# Patient Record
Sex: Male | Born: 1970 | Race: White | Hispanic: No | Smoking: Current every day smoker
Health system: Southern US, Community
[De-identification: ages and names within clinical notes are randomized; demographics above are authoritative.]

## PROBLEM LIST (undated history)

## (undated) DIAGNOSIS — M549 Dorsalgia, unspecified: Secondary | ICD-10-CM

## (undated) DIAGNOSIS — O2230 Deep phlebothrombosis in pregnancy, unspecified trimester: Secondary | ICD-10-CM

## (undated) DIAGNOSIS — Z960 Presence of urogenital implants: Secondary | ICD-10-CM

## (undated) DIAGNOSIS — M869 Osteomyelitis, unspecified: Secondary | ICD-10-CM

## (undated) DIAGNOSIS — G8220 Paraplegia, unspecified: Secondary | ICD-10-CM

## (undated) DIAGNOSIS — G8929 Other chronic pain: Secondary | ICD-10-CM

## (undated) DIAGNOSIS — R252 Cramp and spasm: Secondary | ICD-10-CM

## (undated) DIAGNOSIS — W3400XA Accidental discharge from unspecified firearms or gun, initial encounter: Secondary | ICD-10-CM

## (undated) DIAGNOSIS — Z8614 Personal history of Methicillin resistant Staphylococcus aureus infection: Secondary | ICD-10-CM

## (undated) DIAGNOSIS — Z79891 Long term (current) use of opiate analgesic: Secondary | ICD-10-CM

## (undated) DIAGNOSIS — E7800 Pure hypercholesterolemia, unspecified: Secondary | ICD-10-CM

## (undated) DIAGNOSIS — N319 Neuromuscular dysfunction of bladder, unspecified: Secondary | ICD-10-CM

## (undated) HISTORY — DX: Other chronic pain: G89.29

## (undated) HISTORY — PX: WISDOM TOOTH EXTRACTION: SHX21

## (undated) HISTORY — DX: Deep phlebothrombosis in pregnancy, unspecified trimester: O22.30

## (undated) HISTORY — DX: Paraplegia, unspecified: G82.20

## (undated) HISTORY — DX: Dorsalgia, unspecified: M54.9

## (undated) HISTORY — DX: Pure hypercholesterolemia, unspecified: E78.00

## (undated) HISTORY — DX: Osteomyelitis, unspecified: M86.9

## (undated) HISTORY — DX: Cramp and spasm: R25.2

## (undated) HISTORY — DX: Accidental discharge from unspecified firearms or gun, initial encounter: W34.00XA

## (undated) HISTORY — DX: Long term (current) use of opiate analgesic: Z79.891

## (undated) HISTORY — DX: Neuromuscular dysfunction of bladder, unspecified: N31.9

---

## 1989-08-12 DIAGNOSIS — W3400XA Accidental discharge from unspecified firearms or gun, initial encounter: Secondary | ICD-10-CM

## 1989-08-12 HISTORY — DX: Accidental discharge from unspecified firearms or gun, initial encounter: W34.00XA

## 2005-03-04 ENCOUNTER — Emergency Department: Payer: Self-pay | Admitting: Emergency Medicine

## 2006-08-18 ENCOUNTER — Emergency Department: Payer: Self-pay | Admitting: Emergency Medicine

## 2006-08-22 ENCOUNTER — Emergency Department: Payer: Self-pay | Admitting: Emergency Medicine

## 2006-08-25 ENCOUNTER — Inpatient Hospital Stay: Payer: Self-pay | Admitting: Internal Medicine

## 2006-09-04 ENCOUNTER — Ambulatory Visit: Payer: Self-pay | Admitting: Infectious Diseases

## 2007-01-27 ENCOUNTER — Emergency Department: Payer: Self-pay

## 2007-02-04 ENCOUNTER — Inpatient Hospital Stay: Payer: Self-pay | Admitting: General Surgery

## 2007-05-06 ENCOUNTER — Emergency Department: Payer: Self-pay | Admitting: Emergency Medicine

## 2007-07-02 ENCOUNTER — Encounter: Payer: Self-pay | Admitting: Internal Medicine

## 2007-07-13 ENCOUNTER — Encounter: Payer: Self-pay | Admitting: Internal Medicine

## 2007-09-05 ENCOUNTER — Emergency Department: Payer: Self-pay | Admitting: Emergency Medicine

## 2007-09-10 ENCOUNTER — Encounter: Payer: Self-pay | Admitting: Internal Medicine

## 2007-09-18 ENCOUNTER — Ambulatory Visit: Payer: Self-pay | Admitting: Internal Medicine

## 2007-09-24 ENCOUNTER — Encounter: Payer: Self-pay | Admitting: Internal Medicine

## 2007-09-28 ENCOUNTER — Ambulatory Visit: Payer: Self-pay | Admitting: Internal Medicine

## 2007-10-11 ENCOUNTER — Encounter: Payer: Self-pay | Admitting: Internal Medicine

## 2007-10-16 ENCOUNTER — Encounter: Payer: Self-pay | Admitting: Internal Medicine

## 2007-11-11 ENCOUNTER — Encounter: Payer: Self-pay | Admitting: Internal Medicine

## 2007-12-11 ENCOUNTER — Encounter: Payer: Self-pay | Admitting: Internal Medicine

## 2008-01-04 ENCOUNTER — Ambulatory Visit: Payer: Self-pay

## 2008-01-11 ENCOUNTER — Encounter: Payer: Self-pay | Admitting: Internal Medicine

## 2008-04-13 ENCOUNTER — Ambulatory Visit: Payer: Self-pay | Admitting: Internal Medicine

## 2008-06-15 ENCOUNTER — Ambulatory Visit: Payer: Self-pay

## 2008-08-03 ENCOUNTER — Ambulatory Visit: Payer: Self-pay | Admitting: Internal Medicine

## 2008-08-17 ENCOUNTER — Ambulatory Visit: Payer: Self-pay | Admitting: Internal Medicine

## 2008-08-29 ENCOUNTER — Ambulatory Visit: Payer: Self-pay | Admitting: Internal Medicine

## 2008-09-03 ENCOUNTER — Inpatient Hospital Stay: Payer: Self-pay | Admitting: Internal Medicine

## 2008-09-19 ENCOUNTER — Ambulatory Visit: Payer: Self-pay | Admitting: Internal Medicine

## 2008-10-10 ENCOUNTER — Ambulatory Visit: Payer: Self-pay | Admitting: Internal Medicine

## 2008-10-20 ENCOUNTER — Encounter: Payer: Self-pay | Admitting: Internal Medicine

## 2008-10-21 ENCOUNTER — Ambulatory Visit: Payer: Self-pay | Admitting: Internal Medicine

## 2008-11-10 ENCOUNTER — Ambulatory Visit: Payer: Self-pay | Admitting: Specialist

## 2008-11-21 ENCOUNTER — Encounter: Payer: Self-pay | Admitting: Internal Medicine

## 2008-12-10 ENCOUNTER — Encounter: Payer: Self-pay | Admitting: Internal Medicine

## 2009-01-13 ENCOUNTER — Ambulatory Visit: Payer: Self-pay | Admitting: Internal Medicine

## 2009-01-18 ENCOUNTER — Ambulatory Visit: Payer: Self-pay | Admitting: Internal Medicine

## 2009-01-23 ENCOUNTER — Encounter: Payer: Self-pay | Admitting: Internal Medicine

## 2009-02-27 ENCOUNTER — Encounter: Payer: Self-pay | Admitting: Internal Medicine

## 2009-03-02 ENCOUNTER — Emergency Department: Payer: Self-pay

## 2009-03-10 ENCOUNTER — Inpatient Hospital Stay: Payer: Self-pay | Admitting: Internal Medicine

## 2009-03-24 ENCOUNTER — Ambulatory Visit: Payer: Self-pay | Admitting: Surgery

## 2009-03-27 ENCOUNTER — Ambulatory Visit: Payer: Self-pay | Admitting: Internal Medicine

## 2009-03-29 ENCOUNTER — Ambulatory Visit: Payer: Self-pay | Admitting: Surgery

## 2009-04-05 ENCOUNTER — Ambulatory Visit: Payer: Self-pay | Admitting: Urology

## 2009-04-19 ENCOUNTER — Ambulatory Visit: Payer: Self-pay | Admitting: Family Medicine

## 2009-04-24 ENCOUNTER — Ambulatory Visit: Payer: Self-pay | Admitting: Internal Medicine

## 2009-05-22 ENCOUNTER — Ambulatory Visit: Payer: Self-pay | Admitting: Internal Medicine

## 2009-06-19 ENCOUNTER — Ambulatory Visit: Payer: Self-pay | Admitting: Internal Medicine

## 2009-06-23 ENCOUNTER — Ambulatory Visit: Payer: Self-pay | Admitting: Family Medicine

## 2009-07-11 ENCOUNTER — Ambulatory Visit: Payer: Self-pay | Admitting: Internal Medicine

## 2009-07-20 ENCOUNTER — Ambulatory Visit: Payer: Self-pay | Admitting: Internal Medicine

## 2009-08-12 HISTORY — PX: WOUND DEBRIDEMENT: SHX247

## 2009-08-14 ENCOUNTER — Ambulatory Visit: Payer: Self-pay | Admitting: Internal Medicine

## 2009-09-11 ENCOUNTER — Ambulatory Visit: Payer: Self-pay | Admitting: Internal Medicine

## 2009-10-09 ENCOUNTER — Ambulatory Visit: Payer: Self-pay | Admitting: Internal Medicine

## 2009-11-27 ENCOUNTER — Inpatient Hospital Stay: Payer: Self-pay | Admitting: *Deleted

## 2010-08-12 HISTORY — PX: PORTACATH PLACEMENT: SHX2246

## 2010-08-12 HISTORY — PX: OTHER SURGICAL HISTORY: SHX169

## 2010-08-12 HISTORY — PX: CHOLECYSTECTOMY: SHX55

## 2010-08-12 HISTORY — PX: MUSCLE FLAP CLOSURE: SHX2054

## 2010-09-05 ENCOUNTER — Emergency Department: Payer: Self-pay | Admitting: Unknown Physician Specialty

## 2011-01-02 ENCOUNTER — Inpatient Hospital Stay: Payer: Self-pay | Admitting: Internal Medicine

## 2012-02-25 ENCOUNTER — Emergency Department: Payer: Self-pay | Admitting: Emergency Medicine

## 2012-02-25 LAB — URINALYSIS, COMPLETE
Bilirubin,UR: NEGATIVE
Blood: NEGATIVE
Glucose,UR: NEGATIVE mg/dL (ref 0–75)
Protein: >=500
Specific Gravity: 1.016 (ref 1.003–1.030)
Squamous Epithelial: 7

## 2012-03-06 DIAGNOSIS — N319 Neuromuscular dysfunction of bladder, unspecified: Secondary | ICD-10-CM | POA: Insufficient documentation

## 2012-05-05 DIAGNOSIS — I82409 Acute embolism and thrombosis of unspecified deep veins of unspecified lower extremity: Secondary | ICD-10-CM | POA: Insufficient documentation

## 2012-05-05 DIAGNOSIS — Z9289 Personal history of other medical treatment: Secondary | ICD-10-CM | POA: Insufficient documentation

## 2012-05-05 DIAGNOSIS — G8929 Other chronic pain: Secondary | ICD-10-CM | POA: Insufficient documentation

## 2012-05-05 DIAGNOSIS — G8220 Paraplegia, unspecified: Secondary | ICD-10-CM | POA: Insufficient documentation

## 2012-05-05 DIAGNOSIS — L8990 Pressure ulcer of unspecified site, unspecified stage: Secondary | ICD-10-CM | POA: Insufficient documentation

## 2012-05-05 DIAGNOSIS — Z7983 Long term (current) use of bisphosphonates: Secondary | ICD-10-CM | POA: Insufficient documentation

## 2012-05-06 DIAGNOSIS — E7800 Pure hypercholesterolemia, unspecified: Secondary | ICD-10-CM | POA: Insufficient documentation

## 2012-07-01 ENCOUNTER — Ambulatory Visit: Payer: Self-pay | Admitting: Surgery

## 2012-07-01 LAB — CBC WITH DIFFERENTIAL/PLATELET
Basophil %: 0.8 %
Eosinophil #: 0.2 10*3/uL (ref 0.0–0.7)
HCT: 42.5 % (ref 40.0–52.0)
HGB: 14.8 g/dL (ref 13.0–18.0)
Lymphocyte #: 2.9 10*3/uL (ref 1.0–3.6)
MCH: 32 pg (ref 26.0–34.0)
MCHC: 34.9 g/dL (ref 32.0–36.0)
MCV: 92 fL (ref 80–100)
Monocyte #: 0.7 x10 3/mm (ref 0.2–1.0)
Neutrophil #: 4.1 10*3/uL (ref 1.4–6.5)
WBC: 8 10*3/uL (ref 3.8–10.6)

## 2012-07-01 LAB — BASIC METABOLIC PANEL
BUN: 13 mg/dL (ref 7–18)
Calcium, Total: 8.9 mg/dL (ref 8.5–10.1)
Creatinine: 0.63 mg/dL (ref 0.60–1.30)
EGFR (African American): >60
EGFR (Non-African Amer.): >60
Glucose: 75 mg/dL (ref 65–99)
Potassium: 4.2 mmol/L (ref 3.5–5.1)
Sodium: 141 mmol/L (ref 136–145)

## 2012-07-08 ENCOUNTER — Ambulatory Visit: Payer: Self-pay | Admitting: Surgery

## 2012-07-31 ENCOUNTER — Ambulatory Visit: Payer: Self-pay | Admitting: Anesthesiology

## 2012-09-22 DIAGNOSIS — Z79891 Long term (current) use of opiate analgesic: Secondary | ICD-10-CM | POA: Insufficient documentation

## 2013-08-12 HISTORY — PX: PORT-A-CATH REMOVAL: SHX5289

## 2013-09-13 DIAGNOSIS — M62838 Other muscle spasm: Secondary | ICD-10-CM | POA: Insufficient documentation

## 2014-11-29 NOTE — Op Note (Signed)
PATIENT NAME:  Maurice Simmons, Maurice Simmons MR#:  [id number] DATE OF BIRTH:  December 02, 1970  DATE OF PROCEDURE:  07/08/2012  PREOPERATIVE DIAGNOSIS: Paraplegia, desire for Port-A-Cath removal.   POSTOPERATIVE DIAGNOSIS: Paraplegia, desire for Port-A-Cath removal.   PROCEDURE PERFORMED: Removal of right chest Infuse-a-Port.   SURGEON: Sherri Rad, M.D.   ASSISTANT: None.   ANESTHESIA: Local with heavy intravenous sedation.   SPECIMENS: None. The Port-A-Cath was given to the patient at his request.   DESCRIPTION OF PROCEDURE: With the patient in the supine position, local anesthesia was induced. The right chest was clipped of hair, prepped and draped with ChloraPrep solution. Time-out was observed. Local anesthesia was infiltrated around the chest Port-A-Cath site. A transverse skin incision was fashioned and carried down with sharp dissection and electrocautery to the fibrous capsule. The fibrous capsule was incised. The Port-A-Cath enucleated without difficulty and with the patient in the sitting up position was removed slowly with pressure directed on to the internal jugular vein on the right side. Hemostasis appeared to be adequate on the operative field. The capsule of the Port-A-Cath was excised and discarded. Additional local anesthesia was infiltrated. The wound was obliterated in the deep space with 2-0 Vicryl  interrupted deep dermal, 4-0 Vicryl subcuticular in the skin, benzoin, Steri-Strips, and a sterile occlusive dressing.  The Port-A-Cath was given to the patient at his request.   ____________________________ Jeannette How. Marina Gravel, MD [id number]:bjt D: 07/08/2012 11:03:55 ET T: 07/08/2012 11:16:32 ET JOB#: [id number]  cc:  Dory Horn. Eliberto Ivory, MD Caedyn Raygoza Bettina Gavia MD ELECTRONICALLY SIGNED 07/11/2012 17:30

## 2015-01-30 ENCOUNTER — Ambulatory Visit: Payer: Medicare Other | Attending: Family Medicine | Admitting: Physical Therapy

## 2015-01-30 ENCOUNTER — Encounter: Payer: Self-pay | Admitting: Physical Therapy

## 2015-01-30 DIAGNOSIS — G8220 Paraplegia, unspecified: Secondary | ICD-10-CM | POA: Diagnosis present

## 2015-01-30 DIAGNOSIS — X58XXXD Exposure to other specified factors, subsequent encounter: Secondary | ICD-10-CM | POA: Diagnosis not present

## 2015-01-30 DIAGNOSIS — T148 Other injury of unspecified body region: Secondary | ICD-10-CM | POA: Diagnosis present
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
## 2015-09-12 ENCOUNTER — Other Ambulatory Visit: Payer: Self-pay

## 2015-09-14 ENCOUNTER — Encounter: Payer: Self-pay | Admitting: Surgery

## 2015-09-14 ENCOUNTER — Ambulatory Visit (INDEPENDENT_AMBULATORY_CARE_PROVIDER_SITE_OTHER): Payer: Medicare Other | Admitting: Surgery

## 2015-09-14 ENCOUNTER — Other Ambulatory Visit: Payer: Self-pay

## 2015-09-14 VITALS — BP 163/88 | HR 94 | Temp 97.5°F | Ht 74.0 in | Wt 198.0 lb

## 2015-09-14 DIAGNOSIS — L723 Sebaceous cyst: Secondary | ICD-10-CM | POA: Diagnosis not present

## 2015-09-14 NOTE — Patient Instructions (Signed)
You will need to have an MRI of your chest and follow-up in the office with Dr. Burt Knack afterwards.   Central scheduling will be calling you to get this scheduled. If you have not heard from their office in 3 days, please call and let our office know.   Please follow-up with Dr. Burt Knack as scheduled below.
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
## 2015-10-03 ENCOUNTER — Ambulatory Visit: Admission: RE | Admit: 2015-10-03 | Payer: Medicare Other | Source: Ambulatory Visit

## 2015-10-03 ENCOUNTER — Ambulatory Visit
Admission: RE | Admit: 2015-10-03 | Discharge: 2015-10-03 | Disposition: A | Discharge status: Home / Self Care | Payer: Medicare Other | Source: Ambulatory Visit | Attending: Surgery | Admitting: Surgery

## 2015-10-03 DIAGNOSIS — L723 Sebaceous cyst: Secondary | ICD-10-CM | POA: Diagnosis not present

## 2015-10-03 MED ORDER — GADOBENATE DIMEGLUMINE 529 MG/ML IV SOLN
20.0000 mL | Freq: Once | INTRAVENOUS | Status: AC | PRN
  Administered 2015-10-03: 20 mL via INTRAVENOUS

## 2015-10-06 ENCOUNTER — Encounter: Payer: Self-pay | Admitting: Surgery

## 2015-10-06 ENCOUNTER — Ambulatory Visit (INDEPENDENT_AMBULATORY_CARE_PROVIDER_SITE_OTHER): Payer: Medicare Other | Admitting: Surgery

## 2015-10-06 VITALS — BP 133/82 | HR 112 | Temp 98.4°F | Wt 198.0 lb

## 2015-10-06 DIAGNOSIS — L723 Sebaceous cyst: Secondary | ICD-10-CM | POA: Diagnosis not present

## 2015-10-06 NOTE — Patient Instructions (Signed)
We will plan on your surgery during the week of March 20th at [facility] with Dr. Burt Knack.
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
## 2015-10-09 ENCOUNTER — Telehealth: Payer: Self-pay | Admitting: Surgery

## 2015-10-09 NOTE — Telephone Encounter (Signed)
Pt advised of pre op date/time and sx date. Sx: 11/01/15 with Dr Alison Stalling wall excisional biopsy--sternal mass.  Pre op: 10/24/15 @ 7:30 am.--Office.   Patient made aware to call [phone], between 1-3:00 pm the day before surgery, to find out what time to arrive.

## 2015-10-24 ENCOUNTER — Encounter
Admission: RE | Admit: 2015-10-24 | Discharge: 2015-10-24 | Disposition: A | Discharge status: Home / Self Care | Payer: Medicare Other | Source: Ambulatory Visit | Attending: Surgery | Admitting: Surgery

## 2015-10-24 ENCOUNTER — Other Ambulatory Visit: Payer: Medicare Other

## 2015-10-24 DIAGNOSIS — G8220 Paraplegia, unspecified: Secondary | ICD-10-CM | POA: Insufficient documentation

## 2015-10-24 DIAGNOSIS — E7800 Pure hypercholesterolemia, unspecified: Secondary | ICD-10-CM | POA: Diagnosis not present

## 2015-10-24 DIAGNOSIS — Z01812 Encounter for preprocedural laboratory examination: Secondary | ICD-10-CM | POA: Diagnosis present

## 2015-10-24 HISTORY — DX: Personal history of Methicillin resistant Staphylococcus aureus infection: Z86.14

## 2015-10-24 HISTORY — DX: Presence of urogenital implants: Z96.0

## 2015-10-24 LAB — BASIC METABOLIC PANEL
Anion gap: 2 — ABNORMAL LOW (ref 5–15)
BUN: 9 mg/dL (ref 6–20)
CALCIUM: 9.4 mg/dL (ref 8.9–10.3)
CHLORIDE: 106 mmol/L (ref 101–111)
CO2: 33 mmol/L — AB (ref 22–32)
CREATININE: 0.51 mg/dL — AB (ref 0.61–1.24)
GFR calc Af Amer: >60 mL/min (ref >60)
GFR calc non Af Amer: >60 mL/min (ref >60)
GLUCOSE: 100 mg/dL — AB (ref 65–99)
Potassium: 4.3 mmol/L (ref 3.5–5.1)
Sodium: 141 mmol/L (ref 135–145)

## 2015-10-24 LAB — CBC WITH DIFFERENTIAL/PLATELET
Basophils Absolute: 0.1 10*3/uL (ref 0–0.1)
Basophils Relative: 1 %
EOS ABS: 0.1 10*3/uL (ref 0–0.7)
Eosinophils Relative: 1 %
HCT: 44.5 % (ref 40.0–52.0)
HEMOGLOBIN: 14.9 g/dL (ref 13.0–18.0)
LYMPHS ABS: 2.7 10*3/uL (ref 1.0–3.6)
Lymphocytes Relative: 30 %
MCH: 31.4 pg (ref 26.0–34.0)
MCHC: 33.5 g/dL (ref 32.0–36.0)
MCV: 93.6 fL (ref 80.0–100.0)
MONO ABS: 0.7 10*3/uL (ref 0.2–1.0)
MONOS PCT: 7 %
NEUTROS PCT: 61 %
Neutro Abs: 5.5 10*3/uL (ref 1.4–6.5)
Platelets: 233 10*3/uL (ref 150–440)
RBC: 4.75 MIL/uL (ref 4.40–5.90)
RDW: 13.4 % (ref 11.5–14.5)
WBC: 9 10*3/uL (ref 3.8–10.6)

## 2015-10-24 LAB — SURGICAL PCR SCREEN
MRSA, PCR: NEGATIVE
Staphylococcus aureus: NEGATIVE

## 2015-10-24 NOTE — Patient Instructions (Signed)
  Your procedure is scheduled on: Wednesday 11/01/15 Report to Day Surgery. 2ND FLOOR MEDICAL MALL ENTRANCE To find out your arrival time please call [phone] between 1PM - 3PM on Tuesday 10/31/15.  Remember: Instructions that are not followed completely may result in serious medical risk, up to and including death, or upon the discretion of your surgeon and anesthesiologist your surgery may need to be rescheduled.    __X__ 1. Do not eat food or drink liquids after midnight. No gum chewing or hard candies.     __X__ 2. No Alcohol for 24 hours before or after surgery.   ____ 3. Bring all medications with you on the day of surgery if instructed.    __X__ 4. Notify your doctor if there is any change in your medical condition     (cold, fever, infections).     Do not wear jewelry, make-up, hairpins, clips or nail polish.  Do not wear lotions, powders, or perfumes.   Do not shave 48 hours prior to surgery. Men may shave face and neck.  Do not bring valuables to the hospital.    [facility] is not responsible for any belongings or valuables.               Contacts, dentures or bridgework may not be worn into surgery.  Leave your suitcase in the car. After surgery it may be brought to your room.  For patients admitted to the hospital, discharge time is determined by your                treatment team.   Patients discharged the day of surgery will not be allowed to drive home.   Please read over the following fact sheets that you were given:   MRSA Information and Surgical Site Infection Prevention   __X__ Take these medicines the morning of surgery with A SIP OF WATER:    1. MS CONTIN  2.   3.   4.  5.  6.  ____ Fleet Enema (as directed)   __X__ Use CHG Soap as directed  ____ Use inhalers on the day of surgery  ____ Stop metformin 2 days prior to surgery    ____ Take 1/2 of usual insulin dose the night before surgery and none on the morning of surgery.   ____ Stop  Coumadin/Plavix/aspirin on   __X__ Stop Anti-inflammatories on STOP THE NAPROXEN TODAY   __X__ Stop supplements until after surgery.  STOP THE FISH OIL TODAY  ____ Bring C-Pap to the hospital.

## 2015-11-01 ENCOUNTER — Ambulatory Visit: Payer: Medicare Other | Admitting: Certified Registered"

## 2015-11-01 ENCOUNTER — Encounter: Payer: Self-pay | Admitting: *Deleted

## 2015-11-01 ENCOUNTER — Ambulatory Visit
Admission: RE | Admit: 2015-11-01 | Discharge: 2015-11-01 | Disposition: A | Discharge status: Home / Self Care | Payer: Medicare Other | Source: Ambulatory Visit | Attending: Surgery | Admitting: Surgery

## 2015-11-01 ENCOUNTER — Encounter
Admission: RE | Disposition: A | Discharge status: Home / Self Care | Payer: Self-pay | Source: Ambulatory Visit | Attending: Surgery

## 2015-11-01 DIAGNOSIS — D1801 Hemangioma of skin and subcutaneous tissue: Secondary | ICD-10-CM | POA: Insufficient documentation

## 2015-11-01 DIAGNOSIS — M549 Dorsalgia, unspecified: Secondary | ICD-10-CM | POA: Insufficient documentation

## 2015-11-01 DIAGNOSIS — Z882 Allergy status to sulfonamides status: Secondary | ICD-10-CM | POA: Insufficient documentation

## 2015-11-01 DIAGNOSIS — G8929 Other chronic pain: Secondary | ICD-10-CM | POA: Insufficient documentation

## 2015-11-01 DIAGNOSIS — F17210 Nicotine dependence, cigarettes, uncomplicated: Secondary | ICD-10-CM | POA: Diagnosis not present

## 2015-11-01 DIAGNOSIS — R222 Localized swelling, mass and lump, trunk: Secondary | ICD-10-CM | POA: Diagnosis present

## 2015-11-01 DIAGNOSIS — Z86718 Personal history of other venous thrombosis and embolism: Secondary | ICD-10-CM | POA: Insufficient documentation

## 2015-11-01 DIAGNOSIS — G8220 Paraplegia, unspecified: Secondary | ICD-10-CM | POA: Insufficient documentation

## 2015-11-01 DIAGNOSIS — G9589 Other specified diseases of spinal cord: Secondary | ICD-10-CM | POA: Diagnosis not present

## 2015-11-01 HISTORY — PX: MASS EXCISION: SHX2000

## 2015-11-01 SURGERY — EXCISION MASS
Anesthesia: General

## 2015-11-01 MED ORDER — FAMOTIDINE 20 MG PO TABS
20.0000 mg | ORAL_TABLET | Freq: Once | ORAL | Status: AC
  Administered 2015-11-01: 20 mg via ORAL

## 2015-11-01 MED ORDER — BUPIVACAINE-EPINEPHRINE (PF) 0.25% -1:200000 IJ SOLN
INTRAMUSCULAR | Status: DC | PRN
  Administered 2015-11-01: 30 mL via PERINEURAL

## 2015-11-01 MED ORDER — CEFAZOLIN SODIUM-DEXTROSE 2-3 GM-% IV SOLR
INTRAVENOUS | Status: AC
Start: 2015-11-01 — End: 2015-11-01
  Administered 2015-11-01: 2 g via INTRAVENOUS
  Filled 2015-11-01: qty 50

## 2015-11-01 MED ORDER — LACTATED RINGERS IV SOLN
INTRAVENOUS | Status: DC
  Administered 2015-11-01: 07:00:00 via INTRAVENOUS

## 2015-11-01 MED ORDER — ONDANSETRON HCL 4 MG/2ML IJ SOLN
INTRAMUSCULAR | Status: DC | PRN
  Administered 2015-11-01: 4 mg via INTRAVENOUS

## 2015-11-01 MED ORDER — HEPARIN SODIUM (PORCINE) 5000 UNIT/ML IJ SOLN
INTRAMUSCULAR | Status: AC
  Administered 2015-11-01: 5000 [IU] via SUBCUTANEOUS
  Filled 2015-11-01: qty 1

## 2015-11-01 MED ORDER — CEFAZOLIN SODIUM-DEXTROSE 2-3 GM-% IV SOLR
2.0000 g | INTRAVENOUS | Status: AC
  Administered 2015-11-01: 2 g via INTRAVENOUS

## 2015-11-01 MED ORDER — LIDOCAINE HCL (CARDIAC) 20 MG/ML IV SOLN
INTRAVENOUS | Status: DC | PRN
  Administered 2015-11-01: 100 mg via INTRAVENOUS

## 2015-11-01 MED ORDER — PROPOFOL 500 MG/50ML IV EMUL: INTRAVENOUS | Status: DC | PRN

## 2015-11-01 MED ORDER — BACITRACIN ZINC 500 UNIT/GM EX OINT
TOPICAL_OINTMENT | CUTANEOUS | Status: DC | PRN
  Administered 2015-11-01: 1 via TOPICAL

## 2015-11-01 MED ORDER — HEPARIN SODIUM (PORCINE) 5000 UNIT/ML IJ SOLN
5000.0000 [IU] | Freq: Once | INTRAMUSCULAR | Status: AC
  Administered 2015-11-01: 5000 [IU] via SUBCUTANEOUS

## 2015-11-01 MED ORDER — FENTANYL CITRATE (PF) 100 MCG/2ML IJ SOLN
INTRAMUSCULAR | Status: DC | PRN
  Administered 2015-11-01 (×2): 50 ug via INTRAVENOUS

## 2015-11-01 MED ORDER — BACITRACIN ZINC 500 UNIT/GM EX OINT
TOPICAL_OINTMENT | CUTANEOUS | Status: AC
  Filled 2015-11-01: qty 28.35

## 2015-11-01 MED ORDER — ONDANSETRON HCL 4 MG/2ML IJ SOLN: 4.0000 mg | Freq: Once | INTRAMUSCULAR | Status: DC | PRN

## 2015-11-01 MED ORDER — MIDAZOLAM HCL 2 MG/2ML IJ SOLN
INTRAMUSCULAR | Status: DC | PRN
  Administered 2015-11-01: 2 mg via INTRAVENOUS

## 2015-11-01 MED ORDER — PROPOFOL 10 MG/ML IV BOLUS
INTRAVENOUS | Status: DC | PRN
  Administered 2015-11-01: 200 mg via INTRAVENOUS

## 2015-11-01 MED ORDER — FENTANYL CITRATE (PF) 100 MCG/2ML IJ SOLN: 25.0000 ug | INTRAMUSCULAR | Status: DC | PRN

## 2015-11-01 MED ORDER — FAMOTIDINE 20 MG PO TABS
ORAL_TABLET | ORAL | Status: AC
  Administered 2015-11-01: 20 mg via ORAL
  Filled 2015-11-01: qty 1

## 2015-11-01 MED ORDER — BUPIVACAINE-EPINEPHRINE (PF) 0.25% -1:200000 IJ SOLN
INTRAMUSCULAR | Status: AC
  Filled 2015-11-01: qty 30

## 2015-11-01 SURGICAL SUPPLY — 27 items
BLADE SURG 15 STRL LF DISP TIS (BLADE) ×1 IMPLANT
BLADE SURG 15 STRL SS (BLADE) ×2
CHLORAPREP W/TINT 26ML (MISCELLANEOUS) ×3 IMPLANT
DRAIN PENROSE 1/4X12 LTX (DRAIN) IMPLANT
DRAPE LAPAROTOMY 100X77 ABD (DRAPES) ×3 IMPLANT
ELECT CAUTERY BLADE 6.4 (BLADE) ×3 IMPLANT
ELECT REM PT RETURN 9FT ADLT (ELECTROSURGICAL) ×3
ELECTRODE REM PT RTRN 9FT ADLT (ELECTROSURGICAL) ×1 IMPLANT
GAUZE SPONGE 4X4 12PLY STRL (GAUZE/BANDAGES/DRESSINGS) ×6 IMPLANT
GLOVE BIO SURGEON STRL SZ8 (GLOVE) ×9 IMPLANT
GOWN STRL REUS W/ TWL LRG LVL3 (GOWN DISPOSABLE) ×2 IMPLANT
GOWN STRL REUS W/TWL LRG LVL3 (GOWN DISPOSABLE) ×4
LABEL OR SOLS (LABEL) ×3 IMPLANT
MARGIN MAP 10MM (MISCELLANEOUS) IMPLANT
NEEDLE HYPO 22GX1.5 SAFETY (NEEDLE) ×3 IMPLANT
NS IRRIG 500ML POUR BTL (IV SOLUTION) ×3 IMPLANT
PACK BASIN MINOR ARMC (MISCELLANEOUS) ×3 IMPLANT
PAD ABD DERMACEA PRESS 5X9 (GAUZE/BANDAGES/DRESSINGS) IMPLANT
SPONGE LAP 18X18 5 PK (GAUZE/BANDAGES/DRESSINGS) ×3 IMPLANT
SUT ETHILON 2 0 FS 18 (SUTURE) IMPLANT
SUT ETHILON 3-0 FS-10 30 BLK (SUTURE) ×3
SUT VIC AB 2-0 CT2 27 (SUTURE) ×6 IMPLANT
SUT VIC AB 3-0 SH 27 (SUTURE) ×2
SUT VIC AB 3-0 SH 27X BRD (SUTURE) ×1 IMPLANT
SUTURE EHLN 3-0 FS-10 30 BLK (SUTURE) ×1 IMPLANT
SYR BULB EAR ULCER 3OZ GRN STR (SYRINGE) ×3 IMPLANT
SYRINGE 10CC LL (SYRINGE) ×3 IMPLANT

## 2015-11-01 NOTE — Anesthesia Preprocedure Evaluation (Signed)
Anesthesia Evaluation  Patient identified by MRN, date of birth, ID band Patient awake    Reviewed: Allergy & Precautions, H&P , NPO status , Patient's Chart, lab work & pertinent test results, reviewed documented beta blocker date and time   Airway Mallampati: II  TM Distance: >3 FB Neck ROM: full    Dental  (+) Teeth Intact   Pulmonary neg pulmonary ROS, Current Smoker,    Pulmonary exam normal        Cardiovascular Exercise Tolerance: Good negative cardio ROS Normal cardiovascular exam Rate:Normal     Neuro/Psych negative neurological ROS  negative psych ROS   GI/Hepatic negative GI ROS, Neg liver ROS,   Endo/Other  negative endocrine ROS  Renal/GU negative Renal ROS  negative genitourinary   Musculoskeletal   Abdominal   Peds  Hematology negative hematology ROS (+)   Anesthesia Other Findings   Reproductive/Obstetrics negative OB ROS                             Anesthesia Physical Anesthesia Plan  ASA: III  Anesthesia Plan: General LMA   Post-op Pain Management:    Induction:   Airway Management Planned:   Additional Equipment:   Intra-op Plan:   Post-operative Plan:   Informed Consent: I have reviewed the patients History and Physical, chart, labs and discussed the procedure including the risks, benefits and alternatives for the proposed anesthesia with the patient or authorized representative who has indicated his/her understanding and acceptance.     Plan Discussed with: CRNA  Anesthesia Plan Comments:         Anesthesia Quick Evaluation

## 2015-11-01 NOTE — Transfer of Care (Signed)
Immediate Anesthesia Transfer of Care Note  Patient: Maurice Simmons  Procedure(s) Performed: Procedure(s): EXCISION MASS/ CHEST WALL EXCISIONAL BIOPSY (N/A)  Patient Location: PACU  Anesthesia Type:General  Level of Consciousness: awake, alert  and oriented  Airway & Oxygen Therapy: Patient Spontanous Breathing and Patient connected to face mask oxygen  Post-op Assessment: Report given to RN and Post -op Vital signs reviewed and stable  Post vital signs: Reviewed and stable  Last Vitals:  Filed Vitals:   11/01/15 0815  BP: 128/83  Pulse: 82  Resp: 16    Complications: No apparent anesthesia complications

## 2015-11-01 NOTE — Anesthesia Postprocedure Evaluation (Signed)
Anesthesia Post Note  Patient: Maurice Simmons  Procedure(s) Performed: Procedure(s) (LRB): EXCISION MASS/ CHEST WALL EXCISIONAL BIOPSY (N/A)  Patient location during evaluation: PACU Anesthesia Type: General Level of consciousness: awake and alert Pain management: pain level controlled Vital Signs Assessment: post-procedure vital signs reviewed and stable Respiratory status: spontaneous breathing, nonlabored ventilation, respiratory function stable and patient connected to nasal cannula oxygen Cardiovascular status: blood pressure returned to baseline and stable Postop Assessment: no signs of nausea or vomiting Anesthetic complications: no    Last Vitals:  Filed Vitals:   11/01/15 0914 11/01/15 0930  BP: 139/98 127/69  Pulse: 80 75  Temp: 36.7 C   Resp: 18 18    Last Pain:  Filed Vitals:   11/01/15 0941  PainSc: 5                  Molli Barrows

## 2015-11-01 NOTE — Discharge Instructions (Signed)
°  AMBULATORY SURGERY  DISCHARGE INSTRUCTIONS   1) The drugs that you were given will stay in your system until tomorrow so for the next 24 hours you should not:  A) Drive an automobile B) Make any legal decisions C) Drink any alcoholic beverage   2) You may resume regular meals tomorrow.  Today it is better to start with liquids and gradually work up to solid foods.  You may eat anything you prefer, but it is better to start with liquids, then soup and crackers, and gradually work up to solid foods.   3) Please notify your doctor immediately if you have any unusual bleeding, trouble breathing, redness and pain at the surgery site, drainage, fever, or pain not relieved by medication.    4) Additional Instructions:                Remove dressings tomorrow, may shower tomorrow. Resume all home medications and follow-up in 10 days Friday with Dr. Burt Knack for suture removal     Please contact your physician with any problems or Same Day Surgery at [phone], Monday through Friday 6 am to 4 pm, or [place] at [facility] number at [phone].

## 2015-11-01 NOTE — Progress Notes (Signed)
Preoperative Review   Patient is met in the preoperative holding area. The history is reviewed in the chart and with the patient. I personally reviewed the options and rationale as well as the risks of this procedure that have been previously discussed with the patient. All questions asked by the patient and/or family were answered to their satisfaction. Pt marked. Patient agrees to proceed with this procedure at this time.  Florene Glen M.D. FACS

## 2015-11-01 NOTE — Anesthesia Procedure Notes (Signed)
Procedure Name: LMA Insertion Performed by: Lance Muss Pre-anesthesia Checklist: Patient identified, Emergency Drugs available, Suction available, Patient being monitored and Timeout performed Patient Re-evaluated:Patient Re-evaluated prior to inductionOxygen Delivery Method: Circle system utilized Preoxygenation: Pre-oxygenation with 100% oxygen Intubation Type: IV induction Ventilation: Mask ventilation without difficulty LMA: LMA inserted LMA Size: 4.5 Number of attempts: 1 Tube secured with: Tape Dental Injury: Teeth and Oropharynx as per pre-operative assessment

## 2015-11-01 NOTE — Op Note (Signed)
11/01/2015  8:15 AM  PATIENT:  Maurice Simmons  45 y.o. male  PRE-OPERATIVE DIAGNOSIS:  Chest wall mass  POST-OPERATIVE DIAGNOSIS:  Same  PROCEDURE: Excision of chest wall mass  SURGEON:  Florene Glen MD, FACS   ANESTHESIA:   Gen. with LMA.   Details of Procedure: Informed consent was obtained the patient was taken the operating room prepped and draped sterile fashion and a surgical pause was performed.  A lenticular shaped incision was drawn out and then executed with sharp dissection and electrocautery and a 4 cm mass was elevated that appeared to be a hemangioma this was sent off for examination in formalin. Additional Marcaine was placed for a total of 30 cc hemostasis was with electrocautery and once assuring hemostasis the wound was closed in layers in an intermediate fashion with deep sutures of 2-0 Vicryl or superficial layer of 3-0 Vicryl followed by interrupted horizontal mattress sutures of 3-0 nylon and simple sutures of 3-0 nylon the incision measured 7 cm.   The procedure well the workup occasions he was taken to recovery room in stable condition to be discharged care family and follow-up in 10 days   Florene Glen, MD FACS

## 2015-11-02 LAB — SURGICAL PATHOLOGY

## 2015-11-07 ENCOUNTER — Other Ambulatory Visit: Payer: Self-pay

## 2015-11-09 ENCOUNTER — Ambulatory Visit (INDEPENDENT_AMBULATORY_CARE_PROVIDER_SITE_OTHER): Payer: Medicare Other | Admitting: Surgery

## 2015-11-09 ENCOUNTER — Encounter: Payer: Self-pay | Admitting: Surgery

## 2015-11-09 VITALS — BP 155/80 | HR 106 | Temp 98.3°F | Ht 72.0 in | Wt 195.0 lb

## 2015-11-09 DIAGNOSIS — R222 Localized swelling, mass and lump, trunk: Secondary | ICD-10-CM

## 2015-11-09 NOTE — Patient Instructions (Signed)
We will see you back in the [place] office on Tuesday, 11/14/15 at 1:30pm with the nurse to remove the sutures.  If you have any questions or concerns prior to this appointment, please call our office and speak with a nurse.

## 2015-11-09 NOTE — Progress Notes (Signed)
46 year old male status post chest wall mass excision on March 22 with my partner Dr. Burt Knack. Patient states that he had a little bit of pain initially afterwards but is no longer having pain in that area now. Patient denies having any drainage in the area.  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  Patient healing well.  Would recommend stitches to remain for a few more days considering tension and his smoking hx.  Will have him come for removal in 4-5 days.

## 2015-11-14 ENCOUNTER — Ambulatory Visit (INDEPENDENT_AMBULATORY_CARE_PROVIDER_SITE_OTHER): Payer: Medicare Other

## 2015-11-14 VITALS — BP 146/87 | HR 85 | Temp 98.3°F | Ht 72.0 in | Wt 195.0 lb

## 2015-11-14 DIAGNOSIS — R222 Localized swelling, mass and lump, trunk: Secondary | ICD-10-CM

## 2015-11-14 NOTE — Patient Instructions (Signed)
Dr. Burt Knack will see you back next week as scheduled.  Your sutures have been removed. Steri-strips have been applied. You may shower as you normally do, these will fall off on their own in 7-10 days.  Please call our office with any questions or concerns.

## 2015-11-14 NOTE — Progress Notes (Signed)
Patient seen in office today for suture removal from Mid-Chest excision. Sutures were removed without difficulty. Patient tolerated removal well. Applied benzoin and steri-strips and educated patient on hygiene practices while these are in place.  Dr. Azalee Course would like patient to come back in 1 week to see Dr. Burt Knack to ensure that this area has healed properly. Appointment made.

## 2015-11-21 ENCOUNTER — Ambulatory Visit (INDEPENDENT_AMBULATORY_CARE_PROVIDER_SITE_OTHER): Payer: Medicare Other | Admitting: Surgery

## 2015-11-21 ENCOUNTER — Encounter: Payer: Self-pay | Admitting: Surgery

## 2015-11-21 VITALS — BP 128/73 | HR 109 | Temp 99.2°F | Ht 72.0 in | Wt 195.0 lb

## 2015-11-21 DIAGNOSIS — R222 Localized swelling, mass and lump, trunk: Secondary | ICD-10-CM

## 2015-11-21 NOTE — Patient Instructions (Signed)
Please use Vitamin E cream for the incision site for scarring. Call if you need [place].

## 2015-11-21 NOTE — Progress Notes (Signed)
Status post excision of a chest wall mass that proved to be a hemangioma on visual inspection as well as final pathology. He has no complaints at this time and feels much better not having to sleep on such a large mass on his chest.  Wound healing well no erythema no drainage sutures are removed.  Patient doing very well pathology reviewed with the patient to follow-up on an as-needed basis

## 2016-10-01 ENCOUNTER — Ambulatory Visit: Payer: Medicare Other | Attending: Family Medicine | Admitting: Physical Therapy

## 2016-10-01 ENCOUNTER — Encounter: Payer: Self-pay | Admitting: Physical Therapy

## 2016-10-01 DIAGNOSIS — G8220 Paraplegia, unspecified: Secondary | ICD-10-CM | POA: Diagnosis present

## 2016-10-01 DIAGNOSIS — G8221 Paraplegia, complete: Secondary | ICD-10-CM

## 2016-10-01 NOTE — Therapy (Addendum)
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               Plan - October 22, 2016 1625    Clinical Impression Statement 46 yo Male presents to therapy for a wheelchair evaluation; His current chair is worn out and is not functional. He would benefit from a manual wheelchair, quickie GP for functional mobility; Please see wheelchair evaluation;     Rehab Potential Good   Clinical Impairments Affecting Rehab Potential positive: independent with quickie GP; negative: chronic back pain, weakness; Patient's clinical presentation is stable as he has no new significant changes;    PT Frequency One time visit   PT Treatment/Interventions Patient/family education   Consulted and Agree with Plan of Care Patient      Patient will benefit from skilled therapeutic intervention in order to improve the following deficits and impairments:  Postural dysfunction, Impaired flexibility, Decreased safety awareness, Decreased mobility, Decreased balance, Pain, Improper body mechanics, Hypomobility, Decreased strength, Decreased endurance, Decreased activity tolerance  Visit Diagnosis: 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, 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      G-Codes - 2016-10-22 1628    Functional Assessment Tool Used (Outpatient Only) Clinical judgement, strength/mobility;    Functional Limitation Mobility: Walking and moving around   Mobility: Walking and Moving Around Current Status [phone]) At least 20 percent but less than 40 percent impaired, limited or restricted   Mobility: Walking and Moving Around Goal Status ([id number]) At least 20 percent but less than 40 percent impaired, limited or restricted  Mobility: Walking and Moving Around Discharge Status [phone]) At least 20 percent but less than 40 percent impaired, limited or restricted       Problem List Patient Active Problem List   Diagnosis Date Noted  . Muscle spasticity 09/13/2013  . Long term current use of opiate analgesic 09/22/2012  . Hypercholesteremia 05/06/2012  . Back pain, chronic 05/05/2012  . Personal history of ongoing treatment with alendronate (Fosamax) 05/05/2012  . Decubital ulcer 05/05/2012  . Deep vein thrombosis ([place]) 05/05/2012  . Lower paraplegia ([place]) 05/05/2012  . Personal history of other medical treatment 05/05/2012  . Deep vein thrombosis (DVT) ([place]) 05/05/2012  . 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

## 2017-03-30 IMAGING — MR MR CHEST MEDIASTINUM WO/W CM
9 series · 16 of 16 positions shown · IV contrast (multihance)
Comparison: None.

CLINICAL DATA: Lesion in these center of the chest for 5-6 years
which has become larger over the past year. No known injury.

EXAM:
MRI CHEST WITHOUT AND WITH CONTRAST
TECHNIQUE: Multi planar multi sequence MR imaging of the region of concern was
performed before and after the administration of IV contrast
material.
CONTRAST:  18 mL MULTIHANCE GADOBENATE DIMEGLUMINE 529 MG/ML IV SOLN

[Series 3: T1 · axial · 10.0mm · 0.69mm/px · 1 of 23 slices shown (1 of 2)]
[im 1/23]
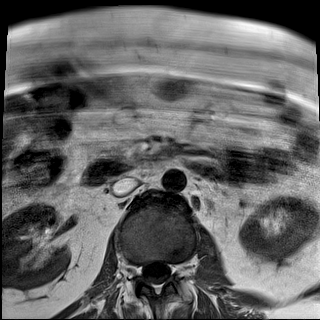

[Series 5: T2 fat-sat · axial · 10.0mm · 0.57mm/px · 1 of 23 slices shown (1 of 2)]
[im 1/23]
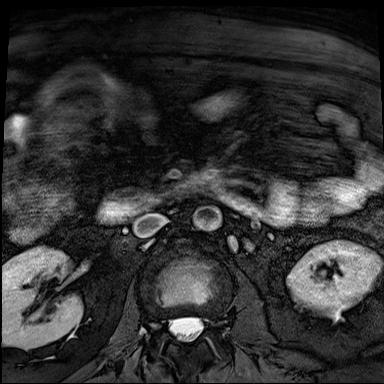

[Series 6: T1 fat-sat · axial · 10.0mm · 0.69mm/px · z∈[-112,+152]mm · 2 of 23 slices shown]
[im 1/23]
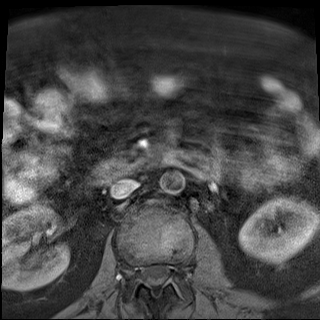
[im 23/23]
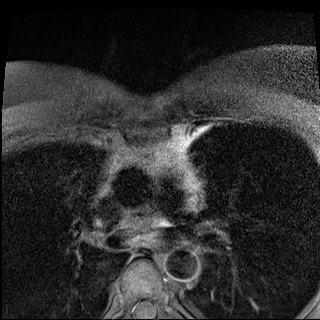

[Series 7: T1 · sagittal · 5.0mm · 0.69mm/px · 2 of 20 slices shown (2 of 2)]
[im 1/20]
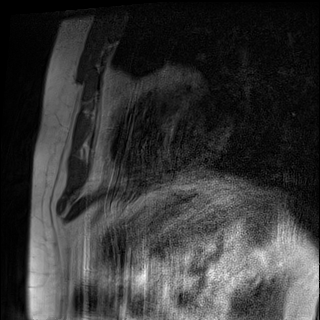
[im 20/20]
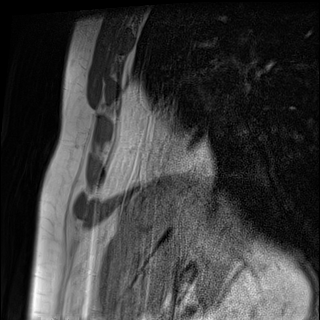

[Series 9: T2 fat-sat · sagittal · 5.0mm · 0.69mm/px · 2 of 20 slices shown (2 of 2)]
[im 1/20]
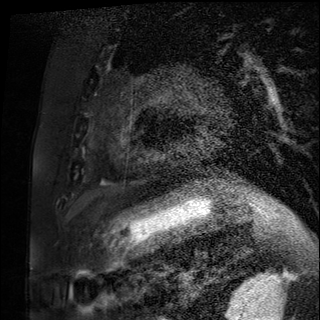
[im 20/20]
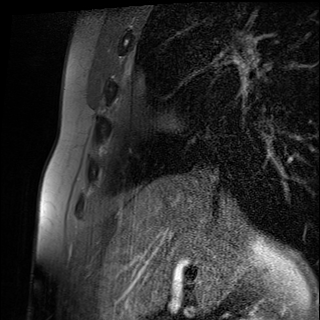

[Series 10: STIR · coronal · 5.0mm · 0.43mm/px · 2 of 21 slices shown]
[im 1/21]
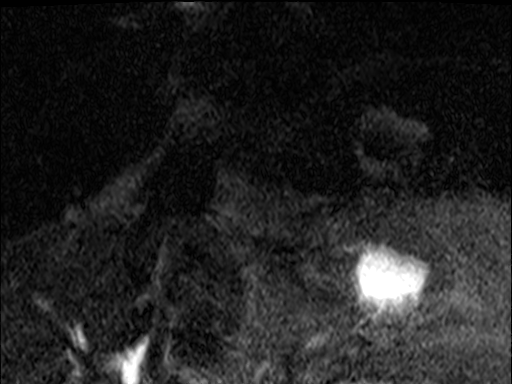
[im 21/21]
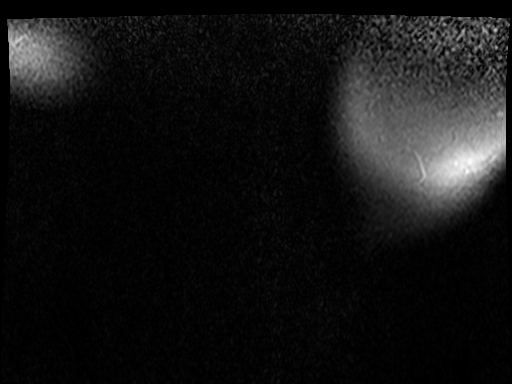

[Series 11: T1 fat-sat post-contrast · axial · 10.0mm · 0.69mm/px · z∈[-112,+152]mm · 2 of 23 slices shown (1 of 3)]
[im 1/23]
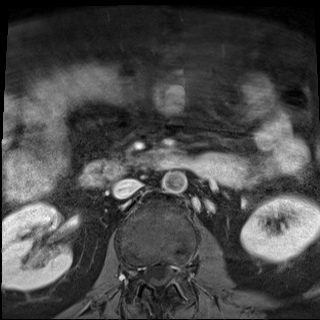
[im 23/23]
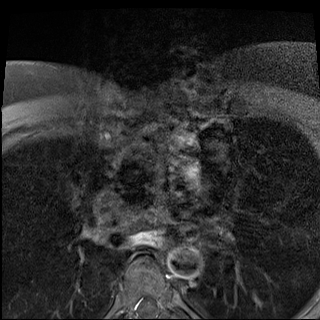

[Series 12: T1 fat-sat post-contrast · sagittal · 5.0mm · 0.69mm/px · 2 of 20 slices shown (2 of 3)]
[im 1/20]
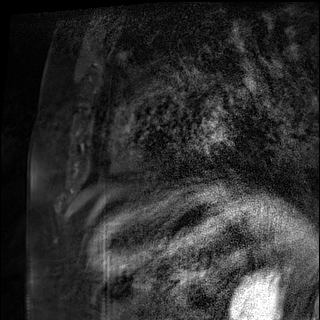
[im 20/20]
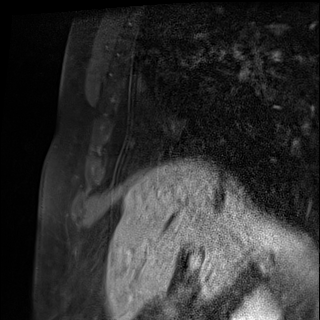

[Series 13: T1 fat-sat post-contrast · coronal · 5.0mm · 0.69mm/px · 2 of 21 slices shown (3 of 3)]
[im 1/21]
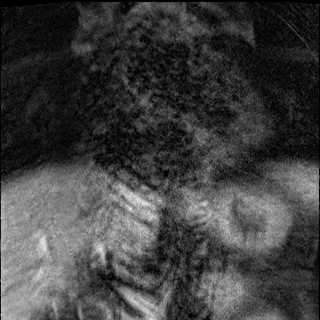
[im 21/21]
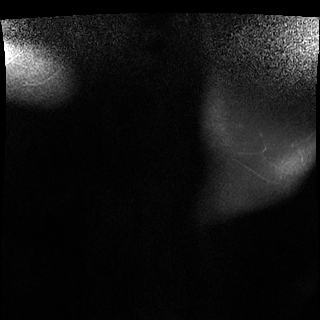

[16 of 16 positions shown; findings below may reference images not displayed]

FINDINGS: There is a lesion in the subcutaneous fatty tissues in the region of
concern which appears to protrude the skin surface. The lesion
measures 1.7 cm AP by 2.5 cm transverse by 3.0 cm craniocaudal. The
lesion is predominantly T2 hyperintense with small areas of T2
hyperintensity identified. It is mainly isointense to muscle on T1
weighted imaging with small foci of increased signal within it. It
demonstrates little to no enhancement after contrast administration.

Imaged osseous structures demonstrate degenerative endplate signal
change in a lower thoracic segment where there is a right
paracentral protrusion. The central canal and foramina appear open
at this level appear. Imaged spine is otherwise unremarkable. Imaged
intra-abdominal contents appear normal.
IMPRESSION: Nonspecific cystic lesion in the cutaneous and subcutaneous fatty
tissues of the anterior chest may be an epidermal inclusion cyst but
cannot be definitively characterized. It is confined to the
cutaneous and subcutaneous fatty tissues and should be amenable to
excisional biopsy.

## 2017-04-11 ENCOUNTER — Ambulatory Visit
Admission: EM | Admit: 2017-04-11 | Discharge: 2017-04-11 | Disposition: A | Discharge status: Home / Self Care | Payer: Medicare Other | Attending: Family Medicine | Admitting: Family Medicine

## 2017-04-11 ENCOUNTER — Encounter: Payer: Self-pay | Admitting: *Deleted

## 2017-04-11 DIAGNOSIS — Z8249 Family history of ischemic heart disease and other diseases of the circulatory system: Secondary | ICD-10-CM | POA: Diagnosis not present

## 2017-04-11 DIAGNOSIS — G8220 Paraplegia, unspecified: Secondary | ICD-10-CM | POA: Insufficient documentation

## 2017-04-11 DIAGNOSIS — R509 Fever, unspecified: Secondary | ICD-10-CM | POA: Insufficient documentation

## 2017-04-11 DIAGNOSIS — Z79899 Other long term (current) drug therapy: Secondary | ICD-10-CM | POA: Diagnosis not present

## 2017-04-11 DIAGNOSIS — Z86718 Personal history of other venous thrombosis and embolism: Secondary | ICD-10-CM | POA: Insufficient documentation

## 2017-04-11 DIAGNOSIS — G8929 Other chronic pain: Secondary | ICD-10-CM | POA: Diagnosis not present

## 2017-04-11 DIAGNOSIS — Z882 Allergy status to sulfonamides status: Secondary | ICD-10-CM | POA: Diagnosis not present

## 2017-04-11 DIAGNOSIS — N390 Urinary tract infection, site not specified: Secondary | ICD-10-CM | POA: Diagnosis not present

## 2017-04-11 DIAGNOSIS — M62838 Other muscle spasm: Secondary | ICD-10-CM | POA: Insufficient documentation

## 2017-04-11 DIAGNOSIS — R319 Hematuria, unspecified: Secondary | ICD-10-CM | POA: Insufficient documentation

## 2017-04-11 DIAGNOSIS — Z803 Family history of malignant neoplasm of breast: Secondary | ICD-10-CM | POA: Diagnosis not present

## 2017-04-11 DIAGNOSIS — E7800 Pure hypercholesterolemia, unspecified: Secondary | ICD-10-CM | POA: Diagnosis not present

## 2017-04-11 DIAGNOSIS — Z9049 Acquired absence of other specified parts of digestive tract: Secondary | ICD-10-CM | POA: Diagnosis not present

## 2017-04-11 DIAGNOSIS — Z8614 Personal history of Methicillin resistant Staphylococcus aureus infection: Secondary | ICD-10-CM | POA: Insufficient documentation

## 2017-04-11 DIAGNOSIS — F17210 Nicotine dependence, cigarettes, uncomplicated: Secondary | ICD-10-CM | POA: Diagnosis not present

## 2017-04-11 DIAGNOSIS — Z9889 Other specified postprocedural states: Secondary | ICD-10-CM | POA: Insufficient documentation

## 2017-04-11 DIAGNOSIS — Z79891 Long term (current) use of opiate analgesic: Secondary | ICD-10-CM | POA: Diagnosis not present

## 2017-04-11 DIAGNOSIS — Z91048 Other nonmedicinal substance allergy status: Secondary | ICD-10-CM | POA: Insufficient documentation

## 2017-04-11 LAB — URINALYSIS, COMPLETE (UACMP) WITH MICROSCOPIC
Bilirubin Urine: NEGATIVE
GLUCOSE, UA: NEGATIVE mg/dL
KETONES UR: NEGATIVE mg/dL
NITRITE: POSITIVE — AB
PROTEIN: 100 mg/dL — AB
Specific Gravity, Urine: 1.025 (ref 1.005–1.030)
pH: 7 (ref 5.0–8.0)

## 2017-04-11 MED ORDER — CIPROFLOXACIN HCL 500 MG PO TABS: 500.0000 mg | ORAL_TABLET | Freq: Two times a day (BID) | ORAL | 0 refills | Status: AC

## 2017-04-11 NOTE — ED Triage Notes (Signed)
Patient started having symptoms of lower abdominal pain, body aches and fever 2 nights ago. Patient has a history of UTI with the afore mentioned symptoms.
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
## 2017-04-14 LAB — URINE CULTURE: Special Requests: NORMAL

## 2017-07-30 ENCOUNTER — Encounter: Payer: Self-pay | Admitting: *Deleted

## 2017-07-30 ENCOUNTER — Ambulatory Visit
Admission: EM | Admit: 2017-07-30 | Discharge: 2017-07-30 | Disposition: A | Discharge status: Home / Self Care | Payer: Medicare Other | Attending: Family Medicine | Admitting: Family Medicine

## 2017-07-30 DIAGNOSIS — G8220 Paraplegia, unspecified: Secondary | ICD-10-CM | POA: Diagnosis not present

## 2017-07-30 DIAGNOSIS — F17210 Nicotine dependence, cigarettes, uncomplicated: Secondary | ICD-10-CM | POA: Insufficient documentation

## 2017-07-30 DIAGNOSIS — Z79899 Other long term (current) drug therapy: Secondary | ICD-10-CM | POA: Diagnosis not present

## 2017-07-30 DIAGNOSIS — G8929 Other chronic pain: Secondary | ICD-10-CM | POA: Diagnosis not present

## 2017-07-30 DIAGNOSIS — Z8614 Personal history of Methicillin resistant Staphylococcus aureus infection: Secondary | ICD-10-CM | POA: Diagnosis not present

## 2017-07-30 DIAGNOSIS — Z86718 Personal history of other venous thrombosis and embolism: Secondary | ICD-10-CM | POA: Diagnosis not present

## 2017-07-30 DIAGNOSIS — R1030 Lower abdominal pain, unspecified: Secondary | ICD-10-CM

## 2017-07-30 DIAGNOSIS — Z87440 Personal history of urinary (tract) infections: Secondary | ICD-10-CM | POA: Diagnosis not present

## 2017-07-30 DIAGNOSIS — E7800 Pure hypercholesterolemia, unspecified: Secondary | ICD-10-CM | POA: Diagnosis not present

## 2017-07-30 DIAGNOSIS — M549 Dorsalgia, unspecified: Secondary | ICD-10-CM | POA: Diagnosis not present

## 2017-07-30 DIAGNOSIS — R5383 Other fatigue: Secondary | ICD-10-CM | POA: Insufficient documentation

## 2017-07-30 DIAGNOSIS — R109 Unspecified abdominal pain: Secondary | ICD-10-CM | POA: Diagnosis present

## 2017-07-30 LAB — COMPREHENSIVE METABOLIC PANEL
ALBUMIN: 4.5 g/dL (ref 3.5–5.0)
ALK PHOS: 117 U/L (ref 38–126)
ALT: 23 U/L (ref 17–63)
ANION GAP: 8 (ref 5–15)
AST: 25 U/L (ref 15–41)
BILIRUBIN TOTAL: 0.5 mg/dL (ref 0.3–1.2)
BUN: 10 mg/dL (ref 6–20)
CALCIUM: 9.1 mg/dL (ref 8.9–10.3)
CO2: 26 mmol/L (ref 22–32)
CREATININE: 0.47 mg/dL — AB (ref 0.61–1.24)
Chloride: 101 mmol/L (ref 101–111)
GFR calc Af Amer: >60 mL/min (ref >60)
GFR calc non Af Amer: >60 mL/min (ref >60)
GLUCOSE: 90 mg/dL (ref 65–99)
Potassium: 4.2 mmol/L (ref 3.5–5.1)
SODIUM: 135 mmol/L (ref 135–145)
TOTAL PROTEIN: 7.8 g/dL (ref 6.5–8.1)

## 2017-07-30 LAB — URINALYSIS, COMPLETE (UACMP) WITH MICROSCOPIC
BACTERIA UA: NONE SEEN
Bilirubin Urine: NEGATIVE
Glucose, UA: NEGATIVE mg/dL
Ketones, ur: NEGATIVE mg/dL
Nitrite: NEGATIVE
PH: 5.5 (ref 5.0–8.0)
Protein, ur: NEGATIVE mg/dL
RBC / HPF: NONE SEEN RBC/hpf (ref 0–5)

## 2017-07-30 LAB — CBC WITH DIFFERENTIAL/PLATELET
BASOS PCT: 1 %
Basophils Absolute: 0.1 10*3/uL (ref 0–0.1)
EOS ABS: 0.1 10*3/uL (ref 0–0.7)
Eosinophils Relative: 1 %
HEMATOCRIT: 45.2 % (ref 40.0–52.0)
HEMOGLOBIN: 15.6 g/dL (ref 13.0–18.0)
LYMPHS ABS: 3.1 10*3/uL (ref 1.0–3.6)
Lymphocytes Relative: 41 %
MCH: 31.2 pg (ref 26.0–34.0)
MCHC: 34.5 g/dL (ref 32.0–36.0)
MCV: 90.3 fL (ref 80.0–100.0)
MONOS PCT: 8 %
Monocytes Absolute: 0.6 10*3/uL (ref 0.2–1.0)
NEUTROS ABS: 3.9 10*3/uL (ref 1.4–6.5)
NEUTROS PCT: 49 %
Platelets: 261 10*3/uL (ref 150–440)
RBC: 5.01 MIL/uL (ref 4.40–5.90)
RDW: 13.6 % (ref 11.5–14.5)
WBC: 7.7 10*3/uL (ref 3.8–10.6)

## 2017-07-30 LAB — CK: Total CK: 121 U/L (ref 49–397)

## 2017-07-30 NOTE — ED Triage Notes (Signed)
Pt seen by PCP 2 weeks ago and dx with UTI and RX of Cipro. Over past 2 weeks has begun to feel worse, lower quad abd pain, right flank pain no appetite, weakness, nausea, headaches.

## 2017-07-30 NOTE — Discharge Instructions (Signed)
Your labs were negative.  We will call regarding the urine culture.  If you worsen, go to the hospital.  Please follow up with your primary and your urologist.  Take care  Dr. Lacinda Axon
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
## 2017-08-02 ENCOUNTER — Telehealth: Payer: Self-pay | Admitting: Family Medicine

## 2017-08-02 LAB — URINE CULTURE: Culture: >=100000 — AB

## 2017-08-02 MED ORDER — AMOXICILLIN 500 MG PO TABS: 500.0000 mg | ORAL_TABLET | Freq: Three times a day (TID) | ORAL | 0 refills | Status: DC

## 2017-08-02 NOTE — Telephone Encounter (Signed)
Culture positive for enterococcus. Sending in Rx for Amox after discussing with pharmacy.

## 2017-10-22 ENCOUNTER — Ambulatory Visit
Admission: EM | Admit: 2017-10-22 | Discharge: 2017-10-22 | Disposition: A | Discharge status: Home / Self Care | Payer: Medicare Other | Attending: Family Medicine | Admitting: Family Medicine

## 2017-10-22 ENCOUNTER — Other Ambulatory Visit: Payer: Self-pay

## 2017-10-22 ENCOUNTER — Encounter: Payer: Self-pay | Admitting: *Deleted

## 2017-10-22 DIAGNOSIS — Z882 Allergy status to sulfonamides status: Secondary | ICD-10-CM | POA: Insufficient documentation

## 2017-10-22 DIAGNOSIS — Z888 Allergy status to other drugs, medicaments and biological substances status: Secondary | ICD-10-CM | POA: Insufficient documentation

## 2017-10-22 DIAGNOSIS — Z9049 Acquired absence of other specified parts of digestive tract: Secondary | ICD-10-CM | POA: Insufficient documentation

## 2017-10-22 DIAGNOSIS — G8220 Paraplegia, unspecified: Secondary | ICD-10-CM | POA: Insufficient documentation

## 2017-10-22 DIAGNOSIS — E7800 Pure hypercholesterolemia, unspecified: Secondary | ICD-10-CM | POA: Diagnosis not present

## 2017-10-22 DIAGNOSIS — N390 Urinary tract infection, site not specified: Secondary | ICD-10-CM | POA: Insufficient documentation

## 2017-10-22 DIAGNOSIS — Z79891 Long term (current) use of opiate analgesic: Secondary | ICD-10-CM | POA: Insufficient documentation

## 2017-10-22 DIAGNOSIS — Z8614 Personal history of Methicillin resistant Staphylococcus aureus infection: Secondary | ICD-10-CM | POA: Diagnosis not present

## 2017-10-22 DIAGNOSIS — M549 Dorsalgia, unspecified: Secondary | ICD-10-CM | POA: Insufficient documentation

## 2017-10-22 DIAGNOSIS — R319 Hematuria, unspecified: Secondary | ICD-10-CM | POA: Insufficient documentation

## 2017-10-22 DIAGNOSIS — Z8249 Family history of ischemic heart disease and other diseases of the circulatory system: Secondary | ICD-10-CM | POA: Diagnosis not present

## 2017-10-22 DIAGNOSIS — Z79899 Other long term (current) drug therapy: Secondary | ICD-10-CM | POA: Insufficient documentation

## 2017-10-22 DIAGNOSIS — Z9889 Other specified postprocedural states: Secondary | ICD-10-CM | POA: Insufficient documentation

## 2017-10-22 DIAGNOSIS — Z86718 Personal history of other venous thrombosis and embolism: Secondary | ICD-10-CM | POA: Diagnosis not present

## 2017-10-22 DIAGNOSIS — F17210 Nicotine dependence, cigarettes, uncomplicated: Secondary | ICD-10-CM | POA: Diagnosis not present

## 2017-10-22 DIAGNOSIS — M62838 Other muscle spasm: Secondary | ICD-10-CM | POA: Diagnosis not present

## 2017-10-22 DIAGNOSIS — Z803 Family history of malignant neoplasm of breast: Secondary | ICD-10-CM | POA: Insufficient documentation

## 2017-10-22 DIAGNOSIS — G8929 Other chronic pain: Secondary | ICD-10-CM | POA: Diagnosis not present

## 2017-10-22 LAB — URINALYSIS, COMPLETE (UACMP) WITH MICROSCOPIC
Bilirubin Urine: NEGATIVE
Glucose, UA: NEGATIVE mg/dL
Ketones, ur: NEGATIVE mg/dL
Nitrite: NEGATIVE
PROTEIN: NEGATIVE mg/dL
Specific Gravity, Urine: 1.015 (ref 1.005–1.030)
pH: 7 (ref 5.0–8.0)

## 2017-10-22 MED ORDER — AMOXICILLIN 875 MG PO TABS: 875.0000 mg | ORAL_TABLET | Freq: Two times a day (BID) | ORAL | 0 refills | Status: AC

## 2017-10-22 NOTE — ED Triage Notes (Signed)
Patient started having symptoms of abdominal and back pain 3 days ago. Patient has a history of UTI.
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
## 2017-10-25 LAB — URINE CULTURE
Culture: >=100000 — AB
Special Requests: NORMAL

## 2018-01-09 ENCOUNTER — Other Ambulatory Visit: Payer: Self-pay

## 2018-01-09 ENCOUNTER — Emergency Department
Admission: EM | Admit: 2018-01-09 | Discharge: 2018-01-09 | Disposition: A | Discharge status: Home / Self Care | Payer: Medicare Other | Attending: Student in an Organized Health Care Education/Training Program | Admitting: Student in an Organized Health Care Education/Training Program

## 2018-01-09 ENCOUNTER — Encounter: Payer: Self-pay | Admitting: Emergency Medicine

## 2018-01-09 DIAGNOSIS — N319 Neuromuscular dysfunction of bladder, unspecified: Secondary | ICD-10-CM | POA: Diagnosis not present

## 2018-01-09 DIAGNOSIS — F17210 Nicotine dependence, cigarettes, uncomplicated: Secondary | ICD-10-CM | POA: Insufficient documentation

## 2018-01-09 DIAGNOSIS — Z8614 Personal history of Methicillin resistant Staphylococcus aureus infection: Secondary | ICD-10-CM | POA: Insufficient documentation

## 2018-01-09 DIAGNOSIS — Z79899 Other long term (current) drug therapy: Secondary | ICD-10-CM | POA: Diagnosis not present

## 2018-01-09 DIAGNOSIS — N3000 Acute cystitis without hematuria: Secondary | ICD-10-CM | POA: Diagnosis not present

## 2018-01-09 DIAGNOSIS — Z960 Presence of urogenital implants: Secondary | ICD-10-CM | POA: Diagnosis not present

## 2018-01-09 DIAGNOSIS — Z978 Presence of other specified devices: Secondary | ICD-10-CM

## 2018-01-09 DIAGNOSIS — R109 Unspecified abdominal pain: Secondary | ICD-10-CM | POA: Diagnosis present

## 2018-01-09 DIAGNOSIS — Z86718 Personal history of other venous thrombosis and embolism: Secondary | ICD-10-CM | POA: Diagnosis not present

## 2018-01-09 LAB — COMPREHENSIVE METABOLIC PANEL
ALBUMIN: 4.7 g/dL (ref 3.5–5.0)
ALT: 30 U/L (ref 17–63)
ANION GAP: 12 (ref 5–15)
AST: 32 U/L (ref 15–41)
Alkaline Phosphatase: 106 U/L (ref 38–126)
BUN: 11 mg/dL (ref 6–20)
CALCIUM: 9.5 mg/dL (ref 8.9–10.3)
CO2: 25 mmol/L (ref 22–32)
Chloride: 101 mmol/L (ref 101–111)
Creatinine, Ser: 0.46 mg/dL — ABNORMAL LOW (ref 0.61–1.24)
GFR calc Af Amer: >60 mL/min (ref >60)
GFR calc non Af Amer: >60 mL/min (ref >60)
GLUCOSE: 118 mg/dL — AB (ref 65–99)
Potassium: 3.7 mmol/L (ref 3.5–5.1)
Sodium: 138 mmol/L (ref 135–145)
TOTAL PROTEIN: 8 g/dL (ref 6.5–8.1)
Total Bilirubin: 0.6 mg/dL (ref 0.3–1.2)

## 2018-01-09 LAB — URINALYSIS, COMPLETE (UACMP) WITH MICROSCOPIC
Bilirubin Urine: NEGATIVE
GLUCOSE, UA: NEGATIVE mg/dL
Hgb urine dipstick: NEGATIVE
Ketones, ur: 20 mg/dL — AB
NITRITE: POSITIVE — AB
PH: 5 (ref 5.0–8.0)
Protein, ur: NEGATIVE mg/dL
SPECIFIC GRAVITY, URINE: 1.012 (ref 1.005–1.030)
WBC, UA: >50 WBC/hpf — ABNORMAL HIGH (ref 0–5)

## 2018-01-09 LAB — CBC
HCT: 46.6 % (ref 40.0–52.0)
HEMOGLOBIN: 16.1 g/dL (ref 13.0–18.0)
MCH: 31.8 pg (ref 26.0–34.0)
MCHC: 34.4 g/dL (ref 32.0–36.0)
MCV: 92.5 fL (ref 80.0–100.0)
Platelets: 264 10*3/uL (ref 150–440)
RBC: 5.04 MIL/uL (ref 4.40–5.90)
RDW: 13.6 % (ref 11.5–14.5)
WBC: 9.9 10*3/uL (ref 3.8–10.6)

## 2018-01-09 LAB — LIPASE, BLOOD: Lipase: 24 U/L (ref 11–51)

## 2018-01-09 MED ORDER — AMOXICILLIN-POT CLAVULANATE 875-125 MG PO TABS: 1.0000 | ORAL_TABLET | Freq: Two times a day (BID) | ORAL | 0 refills | Status: AC

## 2018-01-09 MED ORDER — AMOXICILLIN-POT CLAVULANATE 875-125 MG PO TABS
1.0000 | ORAL_TABLET | Freq: Once | ORAL | Status: AC
  Administered 2018-01-09: 1 via ORAL
  Filled 2018-01-09: qty 1

## 2018-01-09 MED ORDER — SODIUM CHLORIDE 0.9 % IV SOLN: 1.0000 g | Freq: Once | INTRAVENOUS | Status: DC

## 2018-01-09 MED ORDER — SODIUM CHLORIDE 0.9 % IV BOLUS
1000.0000 mL | Freq: Once | INTRAVENOUS | Status: AC
  Administered 2018-01-09: 1000 mL via INTRAVENOUS
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
## 2018-01-09 NOTE — ED Triage Notes (Signed)
Pt arrives via [facility] with c/o mid abdominal pain x 1 week. Pt is a paraplegic who is able to transfer himself normally. Pt describes N/V. Pt is in NAD.

## 2018-01-12 LAB — URINE CULTURE
